# Patient Record
Sex: Male | Born: 1995 | Race: White | Marital: Single | Smoking: Never smoker
Health system: Southern US, Community
[De-identification: ages and names within clinical notes are randomized; demographics above are authoritative.]

---
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
## 2015-01-24 ENCOUNTER — Other Ambulatory Visit: Payer: Self-pay | Admitting: Family Medicine

## 2015-01-24 ENCOUNTER — Ambulatory Visit
Admission: RE | Admit: 2015-01-24 | Discharge: 2015-01-24 | Disposition: A | Discharge status: Home / Self Care | Payer: BLUE CROSS/BLUE SHIELD | Source: Ambulatory Visit | Attending: Family Medicine | Admitting: Family Medicine

## 2015-01-24 ENCOUNTER — Ambulatory Visit
Admission: RE | Admit: 2015-01-24 | Discharge: 2015-01-24 | Disposition: A | Discharge status: Home / Self Care | Payer: Self-pay | Source: Ambulatory Visit | Attending: Family Medicine | Admitting: Family Medicine

## 2015-01-24 DIAGNOSIS — R609 Edema, unspecified: Secondary | ICD-10-CM

## 2015-01-24 DIAGNOSIS — M79644 Pain in right finger(s): Secondary | ICD-10-CM | POA: Insufficient documentation

## 2015-01-24 DIAGNOSIS — M7989 Other specified soft tissue disorders: Secondary | ICD-10-CM | POA: Insufficient documentation

## 2016-10-08 IMAGING — CR DG FINGER THUMB 2+V*R*
1 series · 3 of 3 positions shown · non-contrast
Comparison: None.

CLINICAL DATA: Jamming type injury while playing basketball 1 day
prior. Bruising.

EXAM:
RIGHT THUMB 2+V

[Series 1: x finger pa right · 0.14mm/px · 3 of 3 slices shown]
[im 1/3]
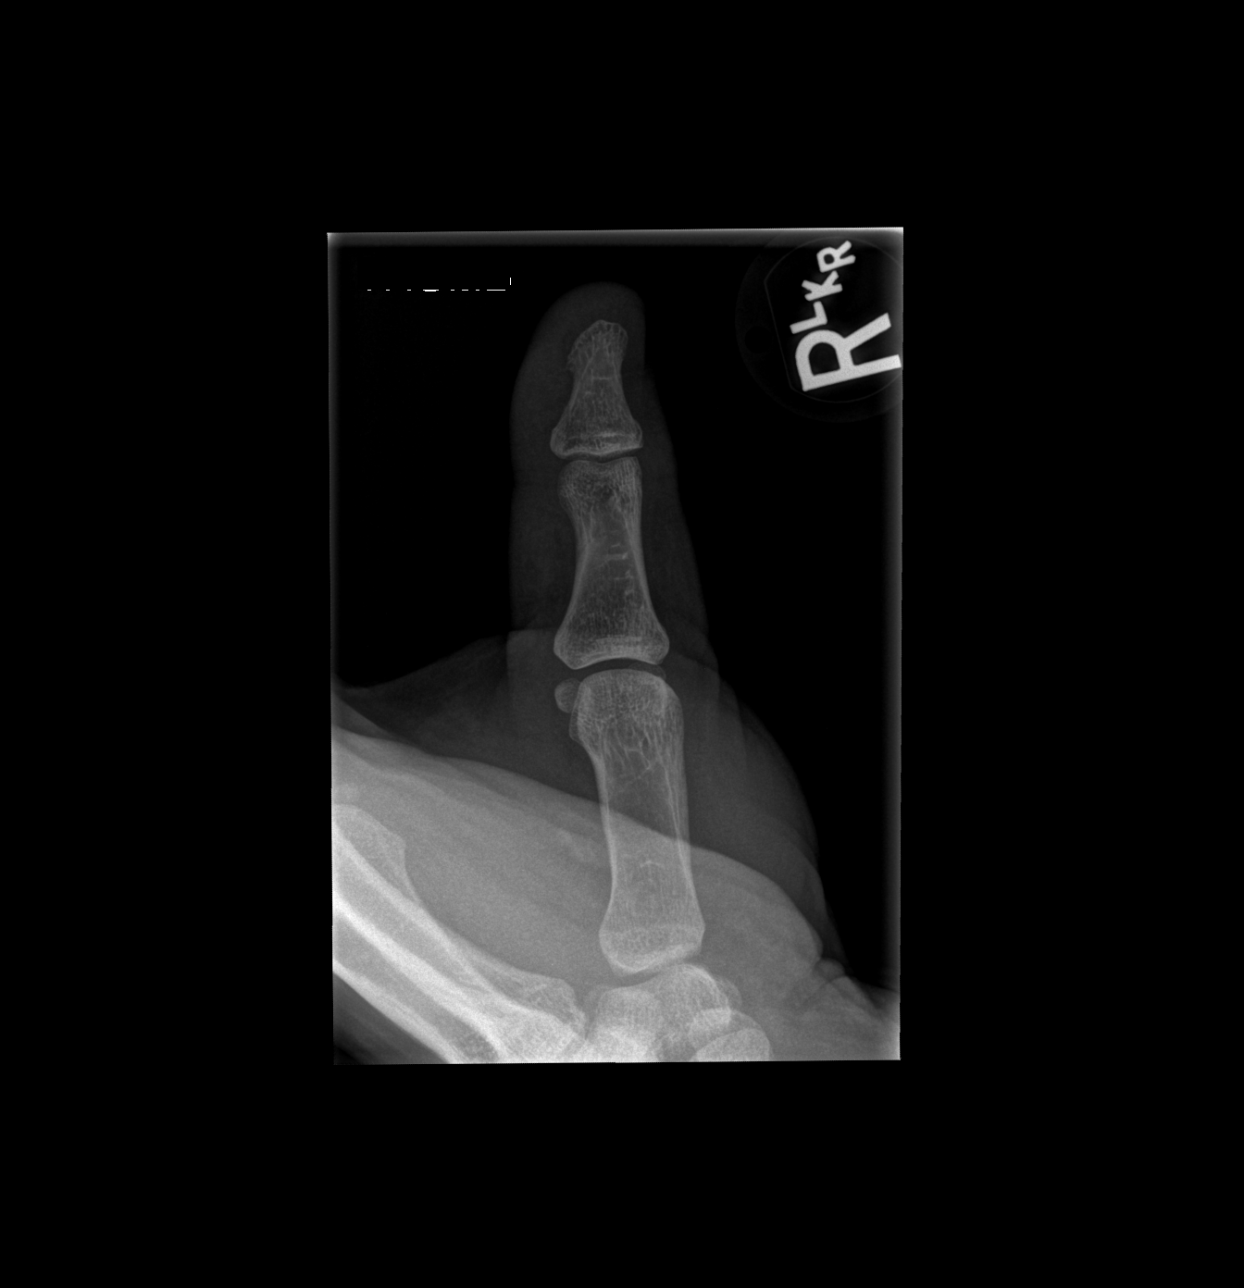
[im 2/3]
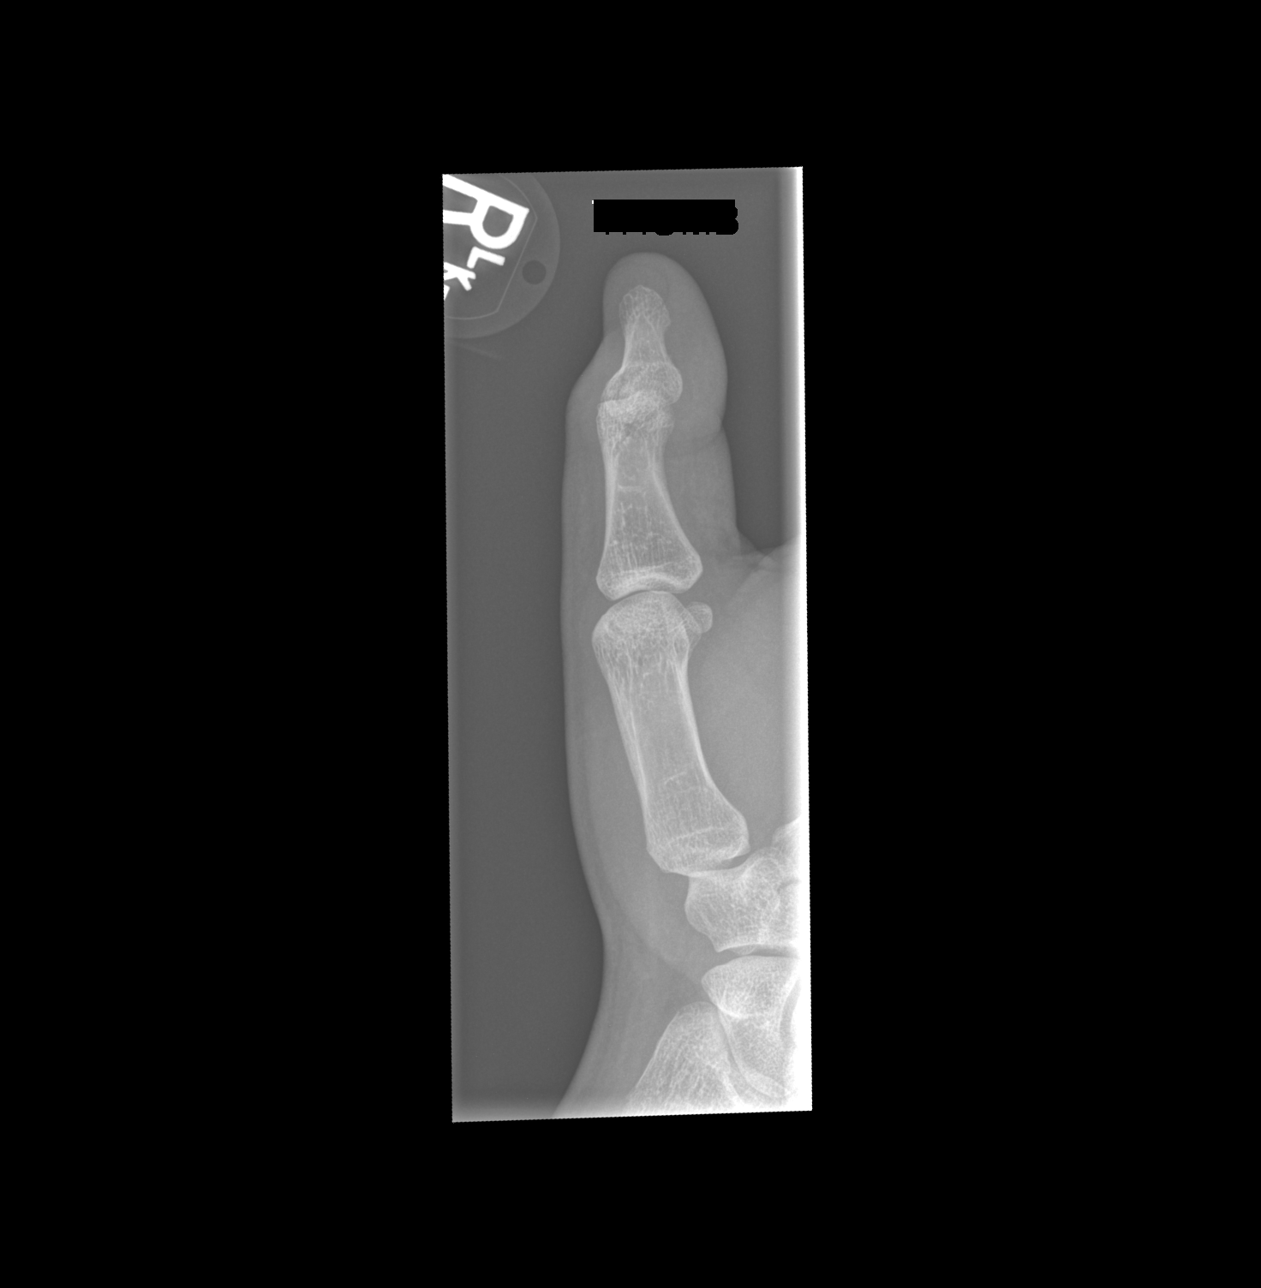
[im 3/3]
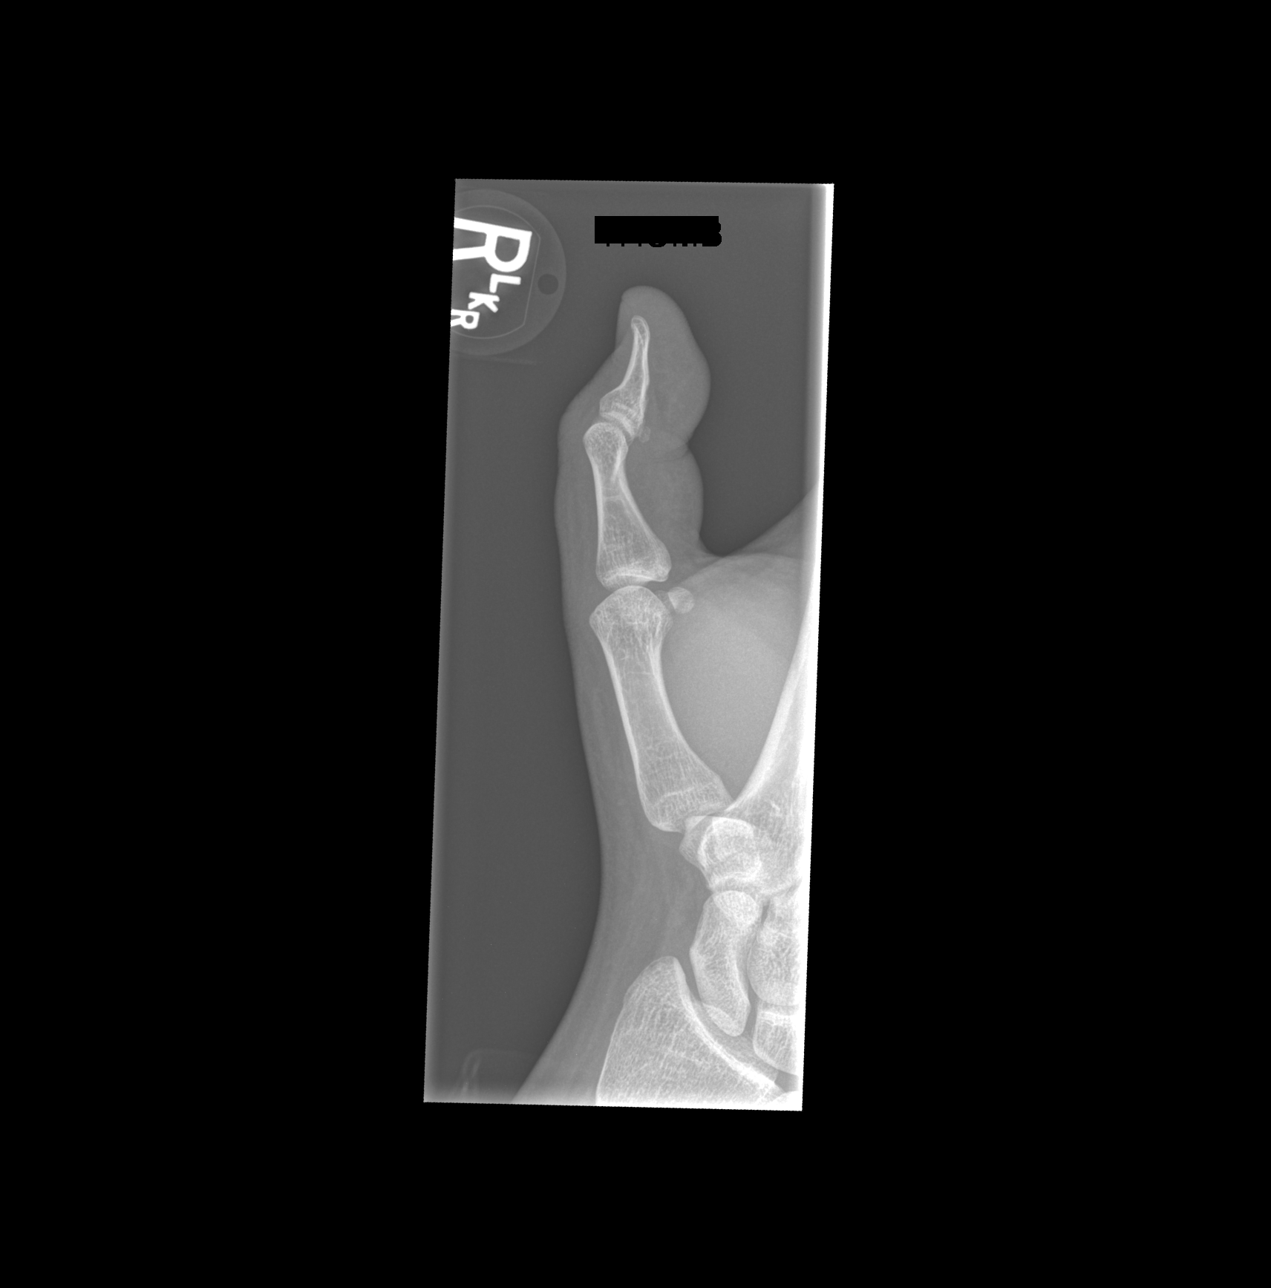

[3 of 3 positions shown; findings below may reference images not displayed]

FINDINGS: Frontal, oblique, and lateral views were obtained. There is soft
tissue swelling throughout the first digit, most notably in the
first IP joint region. On the oblique view, there is a lucency in
the distal aspect of the first proximal phalanx, concerning for
incomplete fracture. No other evidence of fracture. No dislocation.
The joint spaces appear normal. No erosive change.
IMPRESSION: Soft tissue swelling. Suspect incomplete fracture distal aspect
first proximal phalanx with alignment anatomic. No dislocation. No
appreciable arthropathic change.

These results will be called to the ordering clinician or
representative by the Radiologist Assistant, and communication
documented in the PACS or zVision Dashboard.
